# Patient Record
Sex: Male | Born: 1990 | Race: Black or African American | Hispanic: No | Marital: Single | Smoking: Never smoker
Health system: Southern US, Community
[De-identification: ages and names within clinical notes are randomized; demographics above are authoritative.]

## PROBLEM LIST (undated history)

## (undated) DIAGNOSIS — I10 Essential (primary) hypertension: Secondary | ICD-10-CM

---

## 2018-03-15 ENCOUNTER — Emergency Department (HOSPITAL_COMMUNITY)
Admission: EM | Admit: 2018-03-15 | Discharge: 2018-03-15 | Disposition: A | Discharge status: Home / Self Care | Payer: 59 | Attending: Emergency Medicine | Admitting: Emergency Medicine

## 2018-03-15 ENCOUNTER — Encounter (HOSPITAL_COMMUNITY): Payer: Self-pay | Admitting: Emergency Medicine

## 2018-03-15 ENCOUNTER — Other Ambulatory Visit: Payer: Self-pay

## 2018-03-15 DIAGNOSIS — J029 Acute pharyngitis, unspecified: Secondary | ICD-10-CM | POA: Diagnosis present

## 2018-03-15 DIAGNOSIS — M542 Cervicalgia: Secondary | ICD-10-CM | POA: Insufficient documentation

## 2018-03-15 LAB — GROUP A STREP BY PCR: GROUP A STREP BY PCR: NOT DETECTED

## 2018-03-15 MED ORDER — DEXAMETHASONE 4 MG PO TABS
10.0000 mg | ORAL_TABLET | Freq: Once | ORAL | Status: AC
  Administered 2018-03-15: 10 mg via ORAL
  Filled 2018-03-15: qty 2

## 2018-03-15 MED ORDER — IBUPROFEN 600 MG PO TABS: 600.0000 mg | ORAL_TABLET | Freq: Four times a day (QID) | ORAL | 0 refills | Status: AC | PRN

## 2018-03-15 MED ORDER — ACETAMINOPHEN 500 MG PO TABS: 500.0000 mg | ORAL_TABLET | Freq: Four times a day (QID) | ORAL | 0 refills | Status: AC | PRN

## 2018-03-15 NOTE — ED Triage Notes (Signed)
Pt c/o swollen and sore tonsils. Pt's neck tender.

## 2018-03-15 NOTE — ED Provider Notes (Signed)
[facility]-EMERGENCY DEPT Provider Note   CSN: [id number] Arrival date & time: 03/15/18  0758     History   Chief Complaint Chief Complaint  Patient presents with  . Sore Throat  . Lymphadenopathy    HPI Jeremy Burton is a 27 y.o. male who is previously healthy who presents with a 1 day history of sore throats.  Patient has pain when he swallows, however he is able to.  He has had pain in the front of his neck as well.  He denies any fevers at home.  He denies any nasal congestion or ear pain, chest pain, shortness of breath, cough, abdominal pain, nausea, vomiting.  He has not taken any medications at home for symptoms.  He has no known sick contacts, however he works in [employer] housing.  HPI  History reviewed. No pertinent past medical history.  There are no active problems to display for this patient.   History reviewed. No pertinent surgical history.      Home Medications    Prior to Admission medications   Medication Sig Start Date End Date Taking? Authorizing Provider  acetaminophen (TYLENOL) 500 MG tablet Take 1 tablet (500 mg total) by mouth every 6 (six) hours as needed. 03/15/18   Roxi Hlavaty, Waylan Boga, PA-C  ibuprofen (ADVIL,MOTRIN) 600 MG tablet Take 1 tablet (600 mg total) by mouth every 6 (six) hours as needed. 03/15/18   Emi Holes, PA-C    Family History No family history on file.  Social History Social History   Tobacco Use  . Smoking status: Never Smoker  . Smokeless tobacco: Never Used  Substance Use Topics  . Alcohol use: Not on file  . Drug use: Not on file     Allergies   Patient has no known allergies.   Review of Systems Review of Systems  Constitutional: Negative for fever.  HENT: Positive for sore throat. Negative for congestion and ear pain.   Respiratory: Negative for cough and shortness of breath.   Cardiovascular: Negative for chest pain.  Gastrointestinal: Negative for abdominal pain, nausea and  vomiting.  Musculoskeletal: Positive for neck pain.     Physical Exam Updated Vital Signs BP (!) 148/99   Pulse 90   Temp 99.3 F (37.4 C) (Oral)   Resp 16   SpO2 100%   Physical Exam  Constitutional: He appears well-developed and well-nourished. No distress.  HENT:  Head: Normocephalic and atraumatic.  Right Ear: Tympanic membrane normal.  Left Ear: Tympanic membrane normal.  Mouth/Throat: Posterior oropharyngeal edema and posterior oropharyngeal erythema present. No oropharyngeal exudate or tonsillar abscesses. Tonsils are 3+ on the right. Tonsils are 3+ on the left. Tonsillar exudate.  Eyes: Pupils are equal, round, and reactive to light. Conjunctivae are normal. Right eye exhibits no discharge. Left eye exhibits no discharge. No scleral icterus.  Neck: Normal range of motion. Neck supple. No thyromegaly present.  Cardiovascular: Normal rate, regular rhythm, normal heart sounds and intact distal pulses. Exam reveals no gallop and no friction rub.  No murmur heard. Pulmonary/Chest: Effort normal and breath sounds normal. No stridor. No respiratory distress. He has no wheezes. He has no rales.  Abdominal: Soft. Bowel sounds are normal. He exhibits no distension. There is no tenderness. There is no rebound and no guarding.  Musculoskeletal: He exhibits no edema.  Lymphadenopathy:    He has no cervical adenopathy.  Neurological: He is alert. Coordination normal.  Skin: Skin is warm and dry. No rash noted. He is  not diaphoretic. No pallor.  Psychiatric: He has a normal mood and affect.  Nursing note and vitals reviewed.    ED Treatments / Results  Labs (all labs ordered are listed, but only abnormal results are displayed) Labs Reviewed  GROUP A STREP BY PCR    EKG None  Radiology No results found.  Procedures Procedures (including critical care time)  Medications Ordered in ED Medications  dexamethasone (DECADRON) tablet 10 mg (10 mg Oral Given 03/15/18 0834)      Initial Impression / Assessment and Plan / ED Course  I have reviewed the triage vital signs and the nursing notes.  Pertinent labs & imaging results that were available during my care of the patient were reviewed by me and considered in my medical decision making (see chart for details).     Pt with negative strep PCR. Diagnosis of viral pharyngitis. No abx indicated at this time. Discharge with symptomatic tx. No evidence of dehydration. Pt is tolerating secretions. Presentation not concerning for peritonsillar abscess or spread of infection to deep spaces of the throat; patent airway.  Single dose Decadron given in ED.  Specific return precautions discussed. Recommended PCP follow up.  Patient understands and agrees with plan.  Patient vitals stable for ED course and discharged in satisfactory condition.  Final Clinical Impressions(s) / ED Diagnoses   Final diagnoses:  Viral pharyngitis    ED Discharge Orders         Ordered    ibuprofen (ADVIL,MOTRIN) 600 MG tablet  Every 6 hours PRN     03/15/18 0905    acetaminophen (TYLENOL) 500 MG tablet  Every 6 hours PRN     03/15/18 0905           Emi Holes, PA-C 03/15/18 1610    Alvira Monday, MD 03/16/18 2209

## 2018-03-15 NOTE — Discharge Instructions (Signed)
Your strep test was negative today.  You most likely have a viral illness.  This should run its course in about 7 to 10 days.  Alternate ibuprofen and Tylenol as prescribed, as needed for your pain.  Make sure to drink plenty of fluids.  You can use warm salt water gargles 3 times daily to help soothe your throat. You can also drink tea with honey and lemon.  Please return the emergency department immediately if you develop any asymmetry in the back your throat, lockjaw, inability to swallow your own saliva, any large masses in your neck, or any other concerning symptoms.

## 2018-03-18 ENCOUNTER — Other Ambulatory Visit: Payer: Self-pay

## 2018-03-18 ENCOUNTER — Encounter (HOSPITAL_COMMUNITY): Payer: Self-pay | Admitting: Emergency Medicine

## 2018-03-18 ENCOUNTER — Emergency Department (HOSPITAL_COMMUNITY)
Admission: EM | Admit: 2018-03-18 | Discharge: 2018-03-18 | Disposition: A | Discharge status: Home / Self Care | Payer: 59 | Attending: Emergency Medicine | Admitting: Emergency Medicine

## 2018-03-18 DIAGNOSIS — J029 Acute pharyngitis, unspecified: Secondary | ICD-10-CM | POA: Diagnosis not present

## 2018-03-18 DIAGNOSIS — Z79899 Other long term (current) drug therapy: Secondary | ICD-10-CM | POA: Diagnosis not present

## 2018-03-18 DIAGNOSIS — I10 Essential (primary) hypertension: Secondary | ICD-10-CM | POA: Diagnosis not present

## 2018-03-18 MED ORDER — FLUTICASONE PROPIONATE 50 MCG/ACT NA SUSP: 2.0000 | Freq: Every day | NASAL | 0 refills | Status: AC

## 2018-03-18 MED ORDER — DEXAMETHASONE SODIUM PHOSPHATE 10 MG/ML IJ SOLN
10.0000 mg | Freq: Once | INTRAMUSCULAR | Status: AC
  Administered 2018-03-18: 10 mg via INTRAMUSCULAR
  Filled 2018-03-18: qty 1

## 2018-03-18 MED ORDER — LIDOCAINE VISCOUS HCL 2 % MT SOLN: 10.0000 mL | OROMUCOSAL | 0 refills | Status: AC | PRN

## 2018-03-18 NOTE — Discharge Instructions (Signed)
Use warm water salt gargles, warm teas, honey, and throat lozenges for your sore throat.  You can gargle the lidocaine solution up to every 3 hours as needed for sore throat.  Spit this out, do not swallow.  Use Flonase for nasal congestion.  Drink plenty of water and get plenty of rest.  Follow-up with PCP if symptoms persist.  Return to the emergency department if any concerning signs or symptoms develop such as high fevers, throat tightness, drooling, difficulty breathing, or inability to swallow.  If your blood pressure (BP) was elevated on multiple readings during this visit above 130 for the top number or above 80 for the bottom number, please have this repeated by your primary care provider within one month. You can also check your blood pressure when you are out at a pharmacy or grocery store. Many have machines that will check your blood pressure.  If your blood pressure remains elevated, please follow-up with your PCP.

## 2018-03-18 NOTE — ED Triage Notes (Signed)
Pt returns with ongoing sore throat and swollen tonsils that has worsened since 03/15/18 visit.

## 2018-03-18 NOTE — ED Notes (Signed)
Updating bp.

## 2018-03-18 NOTE — ED Provider Notes (Signed)
[facility]-EMERGENCY DEPT Provider Note   CSN: [id number] Arrival date & time: 03/18/18  1744     History   Chief Complaint Chief Complaint  Patient presents with  . Sore Throat    HPI Jeremy Burton is a 27 y.o. male with no significant past medical history presents today for evaluation of acute onset, progressively worsening sore throat for 4 days.  He was seen and evaluated in the ED for this and strep test was negative.  He was discharged with ibuprofen and Tylenol which he has been taking with some improvement.  He notes he has since developed nasal congestion and burning to the back of the throat after using Chloraseptic spray.  He is also been using warm teas and honey with mild improvement.  Denies fevers or chills.  He denies drooling or throat tightness and states that he has been able to tolerate p.o. food and fluids although it has been painful to swallow.  Denies neck stiffness, cough, shortness of breath, or chest pain.  The history is provided by the patient.    History reviewed. No pertinent past medical history.  There are no active problems to display for this patient.   History reviewed. No pertinent surgical history.      Home Medications    Prior to Admission medications   Medication Sig Start Date End Date Taking? Authorizing Provider  acetaminophen (TYLENOL) 500 MG tablet Take 1 tablet (500 mg total) by mouth every 6 (six) hours as needed. 03/15/18   Law, Waylan Boga, PA-C  fluticasone (FLONASE) 50 MCG/ACT nasal spray Place 2 sprays into both nostrils daily. 03/18/18   Carizma Dunsworth A, PA-C  ibuprofen (ADVIL,MOTRIN) 600 MG tablet Take 1 tablet (600 mg total) by mouth every 6 (six) hours as needed. 03/15/18   Law, Waylan Boga, PA-C  lidocaine (XYLOCAINE) 2 % solution Use as directed 10 mLs in the mouth or throat every 3 (three) hours as needed for mouth pain. 03/18/18   Jeanie Sewer, PA-C    Family History History reviewed. No  pertinent family history.  Social History Social History   Tobacco Use  . Smoking status: Never Smoker  . Smokeless tobacco: Never Used  Substance Use Topics  . Alcohol use: Not on file  . Drug use: Not on file     Allergies   Patient has no known allergies.   Review of Systems Review of Systems  Constitutional: Negative for chills and fever.  HENT: Positive for congestion and sore throat.   Respiratory: Negative for shortness of breath.   Cardiovascular: Negative for chest pain.  Musculoskeletal: Negative for neck stiffness.     Physical Exam Updated Vital Signs BP (!) 169/107   Pulse 90   Temp 97.9 F (36.6 C) (Oral)   Resp 16   SpO2 98%   Physical Exam  Constitutional: He appears well-developed and well-nourished. No distress.  HENT:  Head: Normocephalic and atraumatic.  Right Ear: Tympanic membrane normal. No tenderness.  Left Ear: Tympanic membrane normal. No tenderness.  Mouth/Throat: Uvula is midline and mucous membranes are normal. No uvula swelling. Posterior oropharyngeal erythema present. No tonsillar abscesses. Tonsils are 3+ on the right. Tonsils are 3+ on the left. Tonsillar exudate.  Nasal septum midline, mucosal edema noted bilaterally.  No frontomaxillary sinus tenderness.  Posterior oropharynx with bilateral tonsillar hypertrophy, mild exudates, erythema.  No uvular deviation, trismus, or sublingual abnormalities.  Tolerating secretions without difficulty.  Eyes: Conjunctivae are normal. Right eye exhibits no  discharge. Left eye exhibits no discharge.  Neck: Normal range of motion and full passive range of motion without pain. Neck supple. No JVD present. No tracheal deviation present.  Cardiovascular: Normal rate, regular rhythm and normal heart sounds.  Pulmonary/Chest: Effort normal and breath sounds normal. No respiratory distress. He has no wheezes.  Abdominal: He exhibits no distension.  Musculoskeletal: He exhibits no edema.    Lymphadenopathy:    He has cervical adenopathy.  Neurological: He is alert.  Skin: Skin is warm and dry. No erythema.  Psychiatric: He has a normal mood and affect. His behavior is normal.  Nursing note and vitals reviewed.    ED Treatments / Results  Labs (all labs ordered are listed, but only abnormal results are displayed) Labs Reviewed - No data to display  EKG None  Radiology No results found.  Procedures Procedures (including critical care time)  Medications Ordered in ED Medications  dexamethasone (DECADRON) injection 10 mg (has no administration in time range)     Initial Impression / Assessment and Plan / ED Course  I have reviewed the triage vital signs and the nursing notes.  Pertinent labs & imaging results that were available during my care of the patient were reviewed by me and considered in my medical decision making (see chart for details).     Patient with ongoing complaint of sore throat and newly developed nasal congestion.  He is afebrile, hypertensive in the ED.  I instructed the patient to monitor his blood pressure and follow-up with his PCP if it remains elevated.  He is tolerating secretions without difficulty.  Able to drink water in the ED.  Rapid strep performed 3 days ago was negative.  No signs of Ludwig's angina, no evidence of deep space neck abscess.  No fever or meningeal signs to suggest meningitis. IM Decadron given in the ED.  Will discharge with viscous lidocaine and Flonase.  Discussed symptomatic treatment.  Discussed strict ED return precautions. Pt verbalized understanding of and agreement with plan and is safe for discharge home at this time.  He has no complaints prior to discharge.  Final Clinical Impressions(s) / ED Diagnoses   Final diagnoses:  Viral pharyngitis  Hypertension, unspecified type    ED Discharge Orders         Ordered    lidocaine (XYLOCAINE) 2 % solution  Every  3 hours PRN     03/18/18 1821     fluticasone (FLONASE) 50 MCG/ACT nasal spray  Daily     03/18/18 1821           Jeanie Sewer, PA-C 03/18/18 1823    Gwyneth Sprout, MD 03/18/18 2212

## 2019-07-31 ENCOUNTER — Emergency Department (HOSPITAL_COMMUNITY)
Admission: EM | Admit: 2019-07-31 | Discharge: 2019-07-31 | Disposition: A | Discharge status: Home / Self Care | Payer: 59 | Attending: Emergency Medicine | Admitting: Emergency Medicine

## 2019-07-31 ENCOUNTER — Encounter (HOSPITAL_COMMUNITY): Payer: Self-pay | Admitting: Obstetrics and Gynecology

## 2019-07-31 ENCOUNTER — Other Ambulatory Visit: Payer: Self-pay

## 2019-07-31 DIAGNOSIS — I10 Essential (primary) hypertension: Secondary | ICD-10-CM | POA: Insufficient documentation

## 2019-07-31 DIAGNOSIS — Q839 Congenital malformation of breast, unspecified: Secondary | ICD-10-CM

## 2019-07-31 DIAGNOSIS — Z79899 Other long term (current) drug therapy: Secondary | ICD-10-CM | POA: Insufficient documentation

## 2019-07-31 DIAGNOSIS — N6489 Other specified disorders of breast: Secondary | ICD-10-CM | POA: Insufficient documentation

## 2019-07-31 HISTORY — DX: Essential (primary) hypertension: I10

## 2019-07-31 MED ORDER — GRISEOFULVIN MICROSIZE 500 MG PO TABS: 500.0000 mg | ORAL_TABLET | Freq: Every day | ORAL | 0 refills | Status: AC

## 2019-07-31 NOTE — Discharge Instructions (Addendum)
Follow up with your family doc and dermatologist.

## 2019-07-31 NOTE — ED Triage Notes (Signed)
Pt has irritation and raised area to left nipple. D/c noted from nipple, serosanguinous drainage noted. Patient reports itching. Patient denies shaving the area or anyone biting the nipple

## 2019-07-31 NOTE — ED Provider Notes (Signed)
[facility]-EMERGENCY DEPT Provider Note   CSN: [id number] Arrival date & time: 07/31/19  2011     History Chief Complaint  Patient presents with  . Breast Mass    Jeremy Burton is a 29 y.o. male.  29 yo M with a chief complaints of swelling to the left nipple.  Patient's had issues with this for over 6 months.  He had been seen by a dermatologist and provided a steroid cream.  Over the past month or so he is noticed to have some drainage on it and it easily irritated when rubbing against his shirt.  No fevers or chills no masses.  Had some bloody drainage on it yesterday and so decided to come to the ED for evaluation.  He unfortunately had his insurance expired and supposed to be reactivated sometime next week and so he is planning on make an appointment with a dermatologist then.  The history is provided by the patient.  Illness Severity:  Moderate Onset quality:  Gradual Duration:  2 days Timing:  Constant Progression:  Worsening Chronicity:  New Associated symptoms: no abdominal pain, no chest pain, no congestion, no diarrhea, no fever, no headaches, no myalgias, no rash, no shortness of breath and no vomiting        Past Medical History:  Diagnosis Date  . Hypertension     There are no problems to display for this patient.   History reviewed. No pertinent surgical history.     No family history on file.  Social History   Tobacco Use  . Smoking status: Never Smoker  . Smokeless tobacco: Never Used  Substance Use Topics  . Alcohol use: Yes    Comment: Social  . Drug use: Not Currently    Home Medications Prior to Admission medications   Medication Sig Start Date End Date Taking? Authorizing Provider  acetaminophen (TYLENOL) 500 MG tablet Take 1 tablet (500 mg total) by mouth every 6 (six) hours as needed. 03/15/18   Law, Waylan Boga, PA-C  fluticasone (FLONASE) 50 MCG/ACT nasal spray Place 2 sprays into both nostrils daily. 03/18/18    Fawze, Mina A, PA-C  griseofulvin (GRIFULVIN V) 500 MG tablet Take 1 tablet (500 mg total) by mouth daily for 28 days. 07/31/19 08/28/19  Melene Plan, DO  ibuprofen (ADVIL,MOTRIN) 600 MG tablet Take 1 tablet (600 mg total) by mouth every 6 (six) hours as needed. 03/15/18   Law, Waylan Boga, PA-C  lidocaine (XYLOCAINE) 2 % solution Use as directed 10 mLs in the mouth or throat every 3 (three) hours as needed for mouth pain. 03/18/18   Michela Pitcher A, PA-C    Allergies    Patient has no known allergies.  Review of Systems   Review of Systems  Constitutional: Negative for chills and fever.  HENT: Negative for congestion and facial swelling.   Eyes: Negative for discharge and visual disturbance.  Respiratory: Negative for shortness of breath.   Cardiovascular: Negative for chest pain and palpitations.  Gastrointestinal: Negative for abdominal pain, diarrhea and vomiting.  Musculoskeletal: Negative for arthralgias and myalgias.  Skin: Negative for color change and rash.  Neurological: Negative for tremors, syncope and headaches.  Psychiatric/Behavioral: Negative for confusion and dysphoric mood.    Physical Exam Updated Vital Signs BP (!) 168/91 (BP Location: Left Arm)   Pulse 81   Temp 98.8 F (37.1 C) (Oral)   Resp 16   SpO2 99%   Physical Exam Vitals and nursing note reviewed.  Constitutional:  Appearance: He is well-developed.  HENT:     Head: Normocephalic and atraumatic.  Eyes:     Pupils: Pupils are equal, round, and reactive to light.  Neck:     Vascular: No JVD.  Cardiovascular:     Rate and Rhythm: Normal rate and regular rhythm.     Heart sounds: No murmur. No friction rub. No gallop.      Comments: Edema to the nipple, no appreciable fluctuance or induration.  No surrounding erythema.  No obvious mass underlying.  No drainage. Pulmonary:     Effort: No respiratory distress.     Breath sounds: No wheezing.  Abdominal:     General: There is no distension.      Tenderness: There is no abdominal tenderness. There is no guarding or rebound.  Musculoskeletal:        General: Normal range of motion.     Cervical back: Normal range of motion and neck supple.  Skin:    Coloration: Skin is not pale.     Findings: No rash.  Neurological:     Mental Status: He is alert and oriented to person, place, and time.  Psychiatric:        Behavior: Behavior normal.     ED Results / Procedures / Treatments   Labs (all labs ordered are listed, but only abnormal results are displayed) Labs Reviewed - No data to display  EKG None  Radiology No results found.  Procedures Procedures (including critical care time)  Medications Ordered in ED Medications - No data to display  ED Course  I have reviewed the triage vital signs and the nursing notes.  Pertinent labs & imaging results that were available during my care of the patient were reviewed by me and considered in my medical decision making (see chart for details).    MDM Rules/Calculators/A&P                      29 yo M with a chief complaints of left nipple swelling.  This has been an ongoing issue for him.  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  He has been on a reported steroid cream since and over the past month or so has noticed some worsening swelling.  Clinically this could be a fungal infection caused by steroid use.  Will treat with oral antifungals.  I did urge him to follow-up with his family doctor for further evaluation and perhaps imaging to rule out underlying pathology.  I also suggested that he refollow-up with a dermatologist.  10:01 PM:  I have discussed the diagnosis/risks/treatment options with the patient and believe the pt to be eligible for discharge home to follow-up with PCP. We also discussed returning to the ED immediately if new or worsening sx occur. We discussed the sx which are most concerning (e.g., sudden worsening pain, fever, inability to  tolerate by mouth) that necessitate immediate return. Medications administered to the patient during their visit and any new prescriptions provided to the patient are listed below.  Medications given during this visit Medications - No data to display   The patient appears reasonably screen and/or stabilized for discharge and I doubt any other medical condition or other [facility] requiring further screening, evaluation, or treatment in the ED at this time prior to discharge.   Final Clinical Impression(s) / ED Diagnoses Final diagnoses:  Nipple anomaly    Rx / DC Orders ED Discharge Orders  Ordered    griseofulvin (GRIFULVIN V) 500 MG tablet  Daily     07/31/19 2123           Deno Etienne, DO 07/31/19 2201

## 2019-08-29 ENCOUNTER — Ambulatory Visit: Payer: Self-pay | Attending: Internal Medicine

## 2019-08-29 DIAGNOSIS — Z23 Encounter for immunization: Secondary | ICD-10-CM

## 2019-08-29 NOTE — Progress Notes (Signed)
   Covid-19 Vaccination Clinic  Name:  Jeremy Burton    MRN: [id number] DOB: 17-Feb-1991  08/29/2019  [name] was observed post Covid-19 immunization for 15 minutes without incident. He was provided with Vaccine Information Sheet and instruction to access the V-Safe system.   [name] was instructed to call 911 with any severe reactions post vaccine: Marland Kitchen Difficulty breathing  . Swelling of face and throat  . A fast heartbeat  . A bad rash all over body  . Dizziness and weakness   Immunizations Administered    Name Date Dose VIS Date Route   Pfizer COVID-19 Vaccine 08/29/2019  9:34 AM 0.3 mL 05/24/2019 Intramuscular   Manufacturer: [employer], [employer]   Lot: [id number]   NDC: [address]-0

## 2019-09-25 ENCOUNTER — Ambulatory Visit: Payer: Self-pay

## 2020-05-18 ENCOUNTER — Emergency Department (HOSPITAL_COMMUNITY)
Admission: EM | Admit: 2020-05-18 | Discharge: 2020-05-18 | Disposition: A | Discharge status: Home / Self Care | Payer: Self-pay | Attending: Emergency Medicine | Admitting: Emergency Medicine

## 2020-05-18 ENCOUNTER — Other Ambulatory Visit: Payer: Self-pay

## 2020-05-18 ENCOUNTER — Encounter (HOSPITAL_COMMUNITY): Payer: Self-pay

## 2020-05-18 ENCOUNTER — Emergency Department (HOSPITAL_COMMUNITY): Payer: Self-pay

## 2020-05-18 DIAGNOSIS — R2241 Localized swelling, mass and lump, right lower limb: Secondary | ICD-10-CM | POA: Insufficient documentation

## 2020-05-18 DIAGNOSIS — I10 Essential (primary) hypertension: Secondary | ICD-10-CM | POA: Insufficient documentation

## 2020-05-18 LAB — CBC WITH DIFFERENTIAL/PLATELET
Abs Immature Granulocytes: 0.02 10*3/uL (ref 0.00–0.07)
Basophils Absolute: 0 10*3/uL (ref 0.0–0.1)
Basophils Relative: 0 %
Eosinophils Absolute: 0.3 10*3/uL (ref 0.0–0.5)
Eosinophils Relative: 5 %
HCT: 43.3 % (ref 39.0–52.0)
Hemoglobin: 14.6 g/dL (ref 13.0–17.0)
Immature Granulocytes: 0 %
Lymphocytes Relative: 34 %
Lymphs Abs: 2.1 10*3/uL (ref 0.7–4.0)
MCH: 29.8 pg (ref 26.0–34.0)
MCHC: 33.7 g/dL (ref 30.0–36.0)
MCV: 88.4 fL (ref 80.0–100.0)
Monocytes Absolute: 0.3 10*3/uL (ref 0.1–1.0)
Monocytes Relative: 5 %
Neutro Abs: 3.4 10*3/uL (ref 1.7–7.7)
Neutrophils Relative %: 56 %
Platelets: 338 10*3/uL (ref 150–400)
RBC: 4.9 MIL/uL (ref 4.22–5.81)
RDW: 12.4 % (ref 11.5–15.5)
WBC: 6.1 10*3/uL (ref 4.0–10.5)
nRBC: 0 % (ref 0.0–0.2)

## 2020-05-18 LAB — BASIC METABOLIC PANEL
Anion gap: 11 (ref 5–15)
BUN: 16 mg/dL (ref 6–20)
CO2: 25 mmol/L (ref 22–32)
Calcium: 8.9 mg/dL (ref 8.9–10.3)
Chloride: 103 mmol/L (ref 98–111)
Creatinine, Ser: 1.08 mg/dL (ref 0.61–1.24)
GFR, Estimated: >60 mL/min (ref >60)
Glucose, Bld: 100 mg/dL — ABNORMAL HIGH (ref 70–99)
Potassium: 3.6 mmol/L (ref 3.5–5.1)
Sodium: 139 mmol/L (ref 135–145)

## 2020-05-18 LAB — URIC ACID: Uric Acid, Serum: 8.2 mg/dL (ref 3.7–8.6)

## 2020-05-18 MED ORDER — CEPHALEXIN 500 MG PO CAPS: 500.0000 mg | ORAL_CAPSULE | Freq: Four times a day (QID) | ORAL | 0 refills | Status: AC

## 2020-05-18 MED ORDER — COLCHICINE 0.6 MG PO TABS
1.2000 mg | ORAL_TABLET | Freq: Once | ORAL | Status: AC
  Administered 2020-05-18: 1.2 mg via ORAL
  Filled 2020-05-18: qty 2

## 2020-05-18 MED ORDER — COLCHICINE 0.6 MG PO TABS: 0.6000 mg | ORAL_TABLET | Freq: Two times a day (BID) | ORAL | 0 refills | Status: AC

## 2020-05-18 MED ORDER — CEPHALEXIN 500 MG PO CAPS
500.0000 mg | ORAL_CAPSULE | Freq: Once | ORAL | Status: AC
  Administered 2020-05-18: 500 mg via ORAL
  Filled 2020-05-18: qty 1

## 2020-05-18 MED ORDER — COLCHICINE 0.6 MG PO TABS
0.6000 mg | ORAL_TABLET | Freq: Once | ORAL | Status: AC
  Administered 2020-05-18: 0.6 mg via ORAL
  Filled 2020-05-18: qty 1

## 2020-05-18 NOTE — ED Provider Notes (Signed)
[facility] Provider Note   CSN: [id number] Arrival date & time: 05/18/20  1959     History Chief Complaint  Patient presents with  . Right Foot Swelling    Jeremy Burton is a 29 y.o. male.  The history is provided by the patient and medical records.   Jeremy Burton is a 29 y.o. male who presents to the Emergency Department complaining of foot swelling. He presents the emergency department complaining of pain and swelling to his right great toe. He initially had pain and discomfort starting three days ago. Yesterday he noticed that there is significant swelling to the foot. Yesterday he took ibuprofen with improvement but today it is not significantly responding. He denies any fevers, chills, chest pain, shortness of breath. No prior similar symptoms. He has no known medical problems takes no medications.    Past Medical History:  Diagnosis Date  . Hypertension     There are no problems to display for this patient.   History reviewed. No pertinent surgical history.     History reviewed. No pertinent family history.  Social History   Tobacco Use  . Smoking status: Never Smoker  . Smokeless tobacco: Never Used  Vaping Use  . Vaping Use: Never used  Substance Use Topics  . Alcohol use: Yes    Comment: Social  . Drug use: Not Currently    Home Medications Prior to Admission medications   Medication Sig Start Date End Date Taking? Authorizing Provider  acetaminophen (TYLENOL) 500 MG tablet Take 1 tablet (500 mg total) by mouth every 6 (six) hours as needed. 03/15/18   Law, Waylan Boga, PA-C  cephALEXin (KEFLEX) 500 MG capsule Take 1 capsule (500 mg total) by mouth 4 (four) times daily. 05/18/20   Tilden Fossa, MD  colchicine 0.6 MG tablet Take 1 tablet (0.6 mg total) by mouth 2 (two) times daily. 05/18/20   Tilden Fossa, MD  fluticasone [facility]) 50 MCG/ACT nasal spray Place 2 sprays into both nostrils daily. 03/18/18   Fawze,  Mina A, PA-C  ibuprofen (ADVIL,MOTRIN) 600 MG tablet Take 1 tablet (600 mg total) by mouth every 6 (six) hours as needed. 03/15/18   Law, Waylan Boga, PA-C  lidocaine (XYLOCAINE) 2 % solution Use as directed 10 mLs in the mouth or throat every 3 (three) hours as needed for mouth pain. 03/18/18   Michela Pitcher A, PA-C    Allergies    Patient has no known allergies.  Review of Systems   Review of Systems  All other systems reviewed and are negative.   Physical Exam Updated Vital Signs BP (!) 150/99 (BP Location: Right Arm)   Pulse 84   Temp 99.1 F (37.3 C) (Oral)   Resp 18   SpO2 98%   Physical Exam Vitals and nursing note reviewed.  Constitutional:      Appearance: He is well-developed.  HENT:     Head: Normocephalic and atraumatic.  Cardiovascular:     Rate and Rhythm: Normal rate and regular rhythm.  Pulmonary:     Effort: Pulmonary effort is normal. No respiratory distress.  Musculoskeletal:     Comments: 2+ DP pulses bilaterally. There is moderate tenderness and swelling to the right MTP joint with local erythema. There is no significant midfoot tenderness or tenderness to the distal toe.  Skin:    General: Skin is warm and dry.  Neurological:     Mental Status: He is alert and oriented to person, place, and time.  Psychiatric:        Behavior: Behavior normal.     ED Results / Procedures / Treatments   Labs (all labs ordered are listed, but only abnormal results are displayed) Labs Reviewed  BASIC METABOLIC PANEL - Abnormal; Notable for the following components:      Result Value   Glucose, Bld 100 (*)    All other components within normal limits  CBC WITH DIFFERENTIAL/PLATELET  URIC ACID    EKG None  Radiology DG Foot Complete Right  Result Date: 05/18/2020 CLINICAL DATA:  Right foot swelling and pain EXAM: RIGHT FOOT COMPLETE - 3+ VIEW COMPARISON:  None. FINDINGS: There is no evidence of fracture or dislocation. There is no evidence of arthropathy or  other focal bone abnormality. Soft tissue swelling seen around the first digit dorsal aspect of the foot. IMPRESSION: No acute osseous abnormality. Mild soft tissue swelling around the first digit and dorsum of the foot Electronically Signed   By: Jonna Clark M.D.   On: 05/18/2020 21:05    Procedures Procedures (including critical care time)  Medications Ordered in ED Medications  colchicine tablet 0.6 mg (has no administration in time range)  cephALEXin (KEFLEX) capsule 500 mg (has no administration in time range)  colchicine tablet 1.2 mg (1.2 mg Oral Given 05/18/20 2220)    ED Course  I have reviewed the triage vital signs and the nursing notes.  Pertinent labs & imaging results that were available during my care of the patient were reviewed by me and considered in my medical decision making (see chart for details).    MDM Rules/Calculators/A&P                         patient here for evaluation of pain and swelling to his right great toe. He has no past medical history. He has no risk factors for septic arthritis. Exam is concerning for gouty arthritis. Discussed with patient home care for acute gout. Will treat for possible coexistant cellulitis. Discussed with patient home care for gout as well as outpatient follow-up and return precautions.  Final Clinical Impression(s) / ED Diagnoses Final diagnoses:  Localized swelling of toe of right foot    Rx / DC Orders ED Discharge Orders         Ordered    colchicine 0.6 MG tablet  2 times daily        05/18/20 2310    cephALEXin (KEFLEX) 500 MG capsule  4 times daily        05/18/20 2310           Tilden Fossa, MD 05/18/20 2311

## 2020-05-18 NOTE — ED Triage Notes (Signed)
Pt c/o right big toe being swollen. Pt denies any trauma or injury. Pt states its very painful. Pt denies drainage.

## 2021-12-20 IMAGING — CR DG FOOT COMPLETE 3+V*R*
3 series · 3 of 3 positions shown · non-contrast
Comparison: None.

CLINICAL DATA: Right foot swelling and pain

EXAM:
RIGHT FOOT COMPLETE - 3+ VIEW

[x foot ap right]
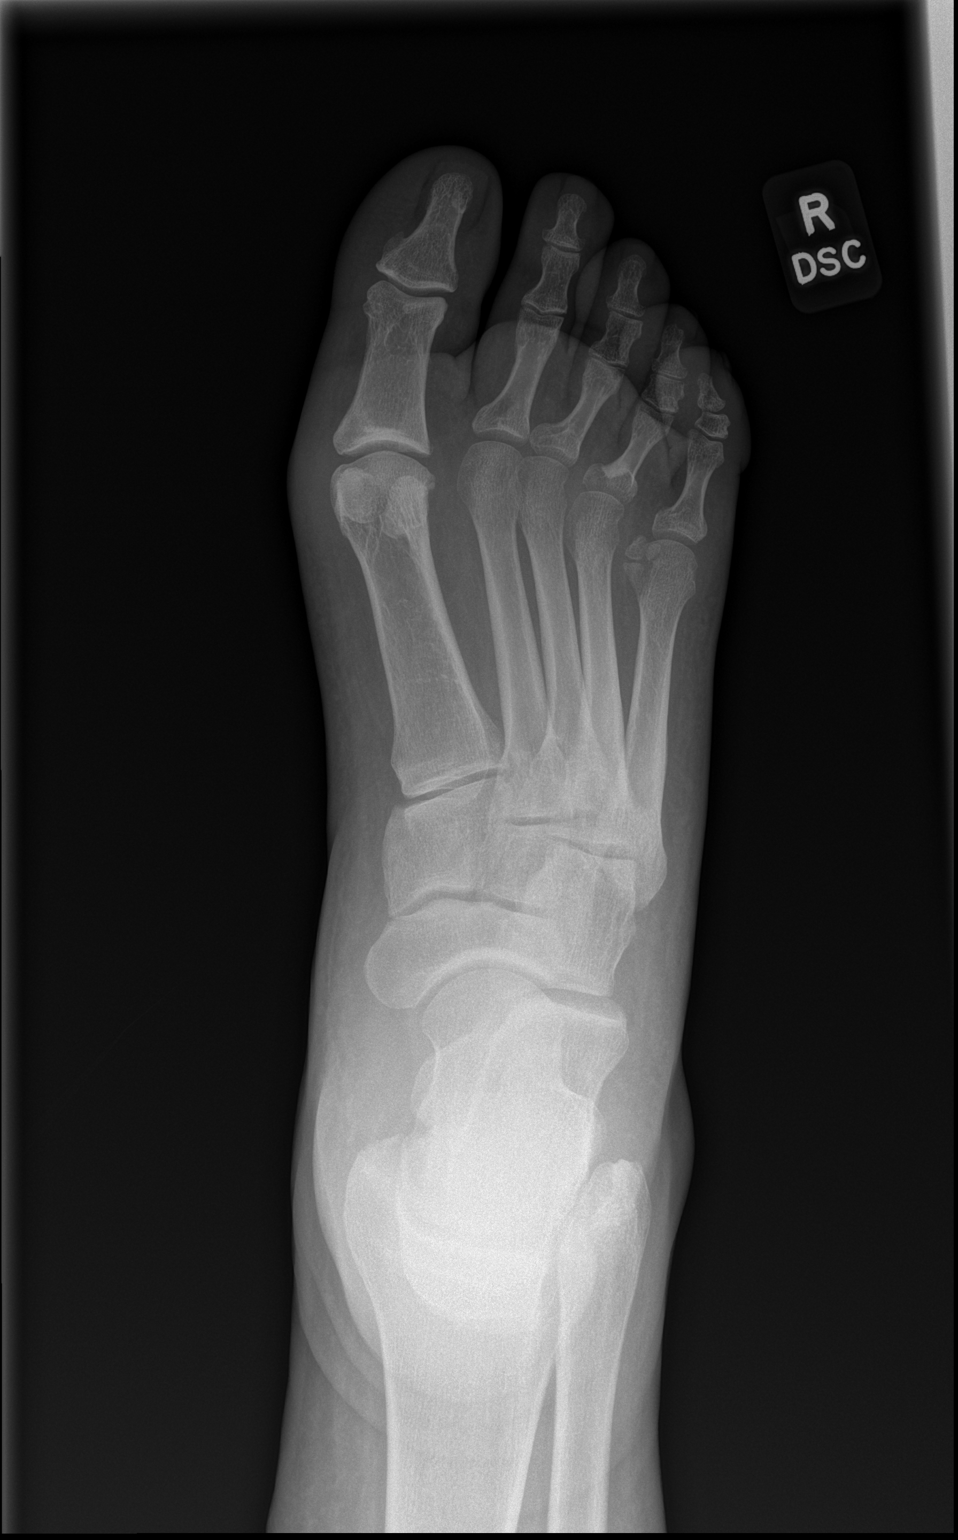

[x foot obl right]
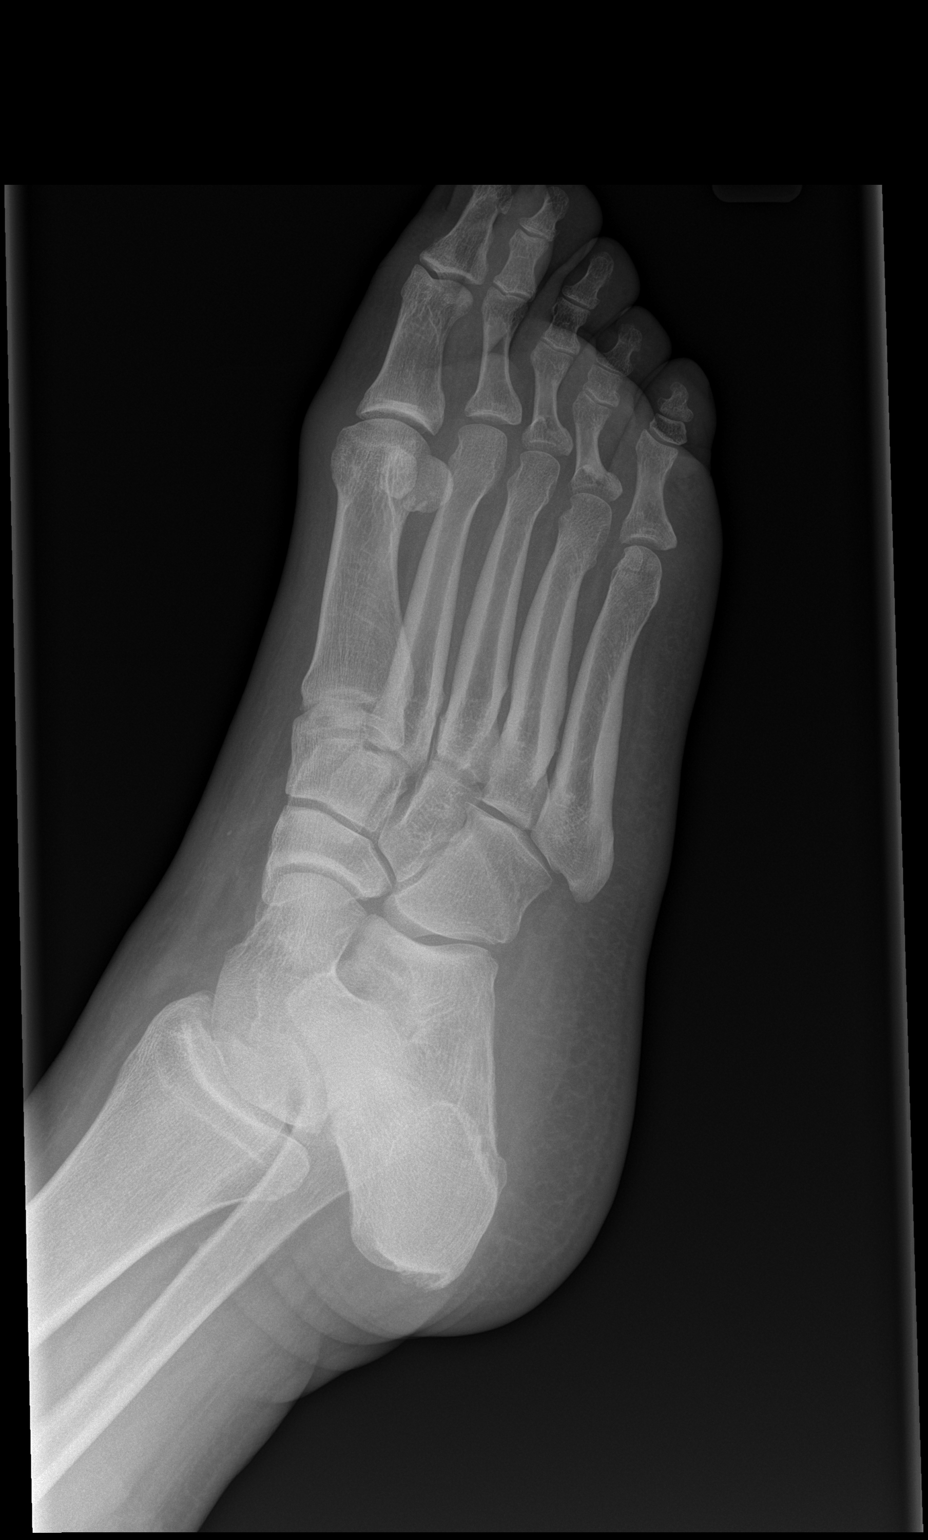

[x foot lat right]
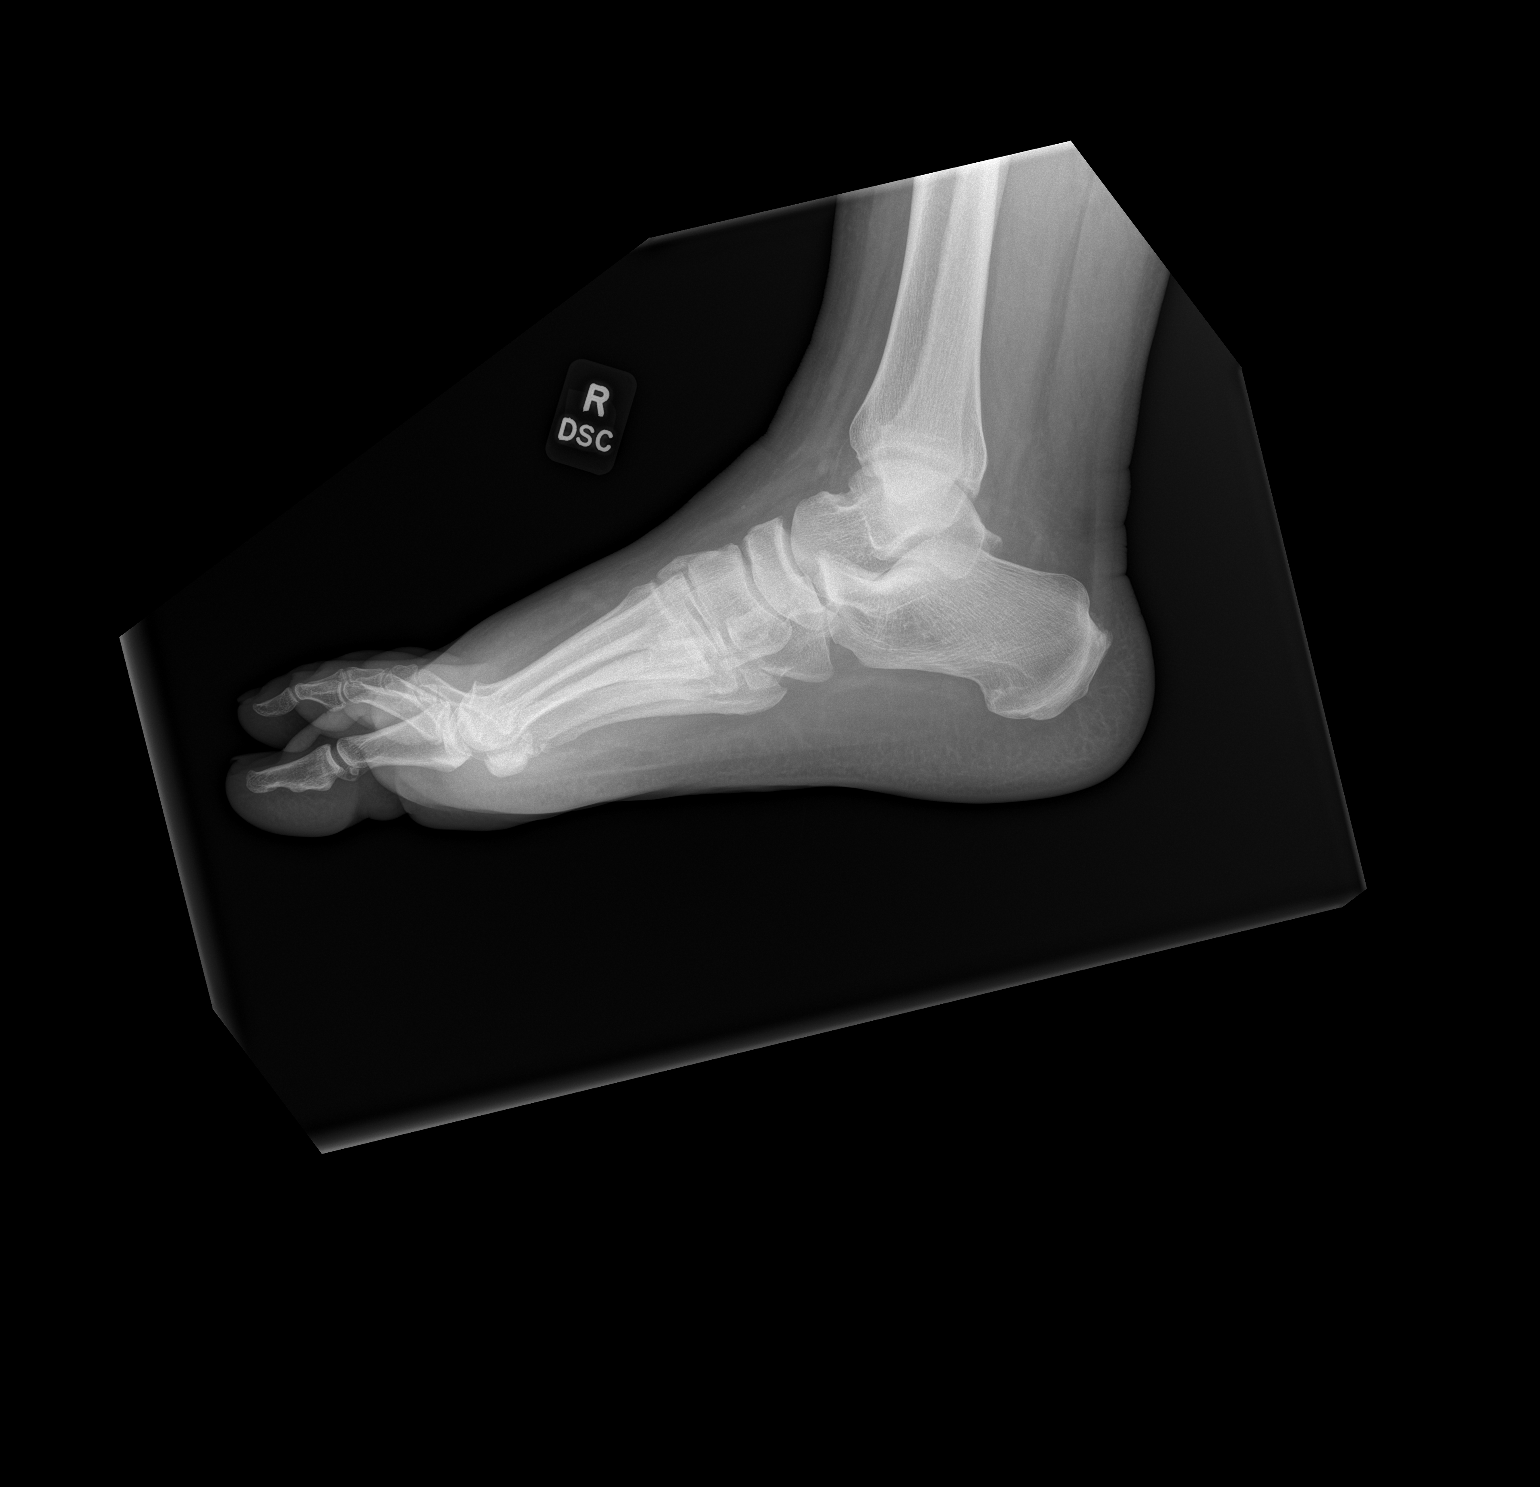

[3 of 3 positions shown; findings below may reference images not displayed]

FINDINGS: There is no evidence of fracture or dislocation. There is no
evidence of arthropathy or other focal bone abnormality. Soft tissue
swelling seen around the first digit dorsal aspect of the foot.
IMPRESSION: No acute osseous abnormality. Mild soft tissue swelling around the
first digit and dorsum of the foot
